# Patient Record
Sex: Male | Born: 1975 | Marital: Married
Health system: Southern US, Community
[De-identification: ages and names within clinical notes are randomized; demographics above are authoritative.]

## PROBLEM LIST (undated history)

## (undated) DIAGNOSIS — G959 Disease of spinal cord, unspecified: Secondary | ICD-10-CM

## (undated) DIAGNOSIS — E119 Type 2 diabetes mellitus without complications: Secondary | ICD-10-CM

## (undated) HISTORY — DX: Type 2 diabetes mellitus without complications: E11.9

## (undated) HISTORY — DX: Disease of spinal cord, unspecified: G95.9

---

## 2019-10-10 HISTORY — PX: CERVICAL FUSION: SHX112

## 2020-07-30 ENCOUNTER — Encounter: Payer: Self-pay | Admitting: Family Medicine

## 2020-08-26 ENCOUNTER — Other Ambulatory Visit: Payer: Self-pay

## 2020-08-26 ENCOUNTER — Ambulatory Visit (HOSPITAL_BASED_OUTPATIENT_CLINIC_OR_DEPARTMENT_OTHER)
Admission: RE | Admit: 2020-08-26 | Discharge: 2020-08-26 | Disposition: A | Discharge status: Home / Self Care | Payer: 59 | Source: Ambulatory Visit | Attending: Family Medicine | Admitting: Family Medicine

## 2020-08-26 ENCOUNTER — Ambulatory Visit (INDEPENDENT_AMBULATORY_CARE_PROVIDER_SITE_OTHER): Payer: 59 | Admitting: Family Medicine

## 2020-08-26 ENCOUNTER — Encounter: Payer: Self-pay | Admitting: Family Medicine

## 2020-08-26 VITALS — BP 140/60 | Ht 76.0 in | Wt 250.0 lb

## 2020-08-26 DIAGNOSIS — F0781 Postconcussional syndrome: Secondary | ICD-10-CM | POA: Insufficient documentation

## 2020-08-26 DIAGNOSIS — Z981 Arthrodesis status: Secondary | ICD-10-CM

## 2020-08-26 DIAGNOSIS — R55 Syncope and collapse: Secondary | ICD-10-CM

## 2020-08-26 DIAGNOSIS — S0993XA Unspecified injury of face, initial encounter: Secondary | ICD-10-CM | POA: Diagnosis not present
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
## 2020-08-26 NOTE — Assessment & Plan Note (Signed)
Surgery was in April 2021.  Reports he was having spinal stenosis and changes in the upper extremity.  Had felt improved after the surgery. -Counseled on supportive care. -X-ray.

## 2020-08-26 NOTE — Patient Instructions (Addendum)
Nice to meet you Please call the [phone] to make sure you have an appointment with the neurologist on 4/13. The information is down below.  I'll call with the xray results.   Please send me a message in MyChart with any questions or updates.  We'll setup a virtual visit once the CT is resulted. .   --Dr. Gerrie Nordmann, Norlene Campbell, MD  [address]  [place], [place] [address]  [phone] (Work)  [phone] (Fax)
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
## 2020-08-28 ENCOUNTER — Encounter: Payer: Self-pay | Admitting: Family Medicine

## 2020-08-30 ENCOUNTER — Encounter: Payer: Self-pay | Admitting: *Deleted

## 2020-08-30 ENCOUNTER — Telehealth: Payer: Self-pay | Admitting: Family Medicine

## 2020-08-30 DIAGNOSIS — Z981 Arthrodesis status: Secondary | ICD-10-CM

## 2020-08-30 NOTE — Telephone Encounter (Signed)
Informed of x-ray results.  Concern for loosening of his hardware.  His fusion.  We will proceed with CT of the cervical neck to evaluate for surgical hardware loosening.  Myra Rude, MD [facility] 08/30/2020, 9:28 AM

## 2020-09-03 ENCOUNTER — Ambulatory Visit (HOSPITAL_BASED_OUTPATIENT_CLINIC_OR_DEPARTMENT_OTHER): Payer: 59

## 2020-09-03 ENCOUNTER — Ambulatory Visit: Payer: 59

## 2020-09-03 ENCOUNTER — Ambulatory Visit (INDEPENDENT_AMBULATORY_CARE_PROVIDER_SITE_OTHER): Payer: 59

## 2020-09-03 ENCOUNTER — Other Ambulatory Visit: Payer: Self-pay

## 2020-09-03 DIAGNOSIS — M542 Cervicalgia: Secondary | ICD-10-CM | POA: Diagnosis not present

## 2020-09-03 DIAGNOSIS — F0781 Postconcussional syndrome: Secondary | ICD-10-CM

## 2020-09-03 DIAGNOSIS — Z981 Arthrodesis status: Secondary | ICD-10-CM

## 2020-09-03 DIAGNOSIS — S0993XA Unspecified injury of face, initial encounter: Secondary | ICD-10-CM | POA: Diagnosis not present

## 2020-09-03 DIAGNOSIS — R55 Syncope and collapse: Secondary | ICD-10-CM | POA: Diagnosis not present

## 2020-09-07 ENCOUNTER — Other Ambulatory Visit: Payer: Self-pay

## 2020-09-07 ENCOUNTER — Telehealth (INDEPENDENT_AMBULATORY_CARE_PROVIDER_SITE_OTHER): Payer: 59 | Admitting: Family Medicine

## 2020-09-07 VITALS — Ht 76.0 in | Wt 250.0 lb

## 2020-09-07 DIAGNOSIS — G8929 Other chronic pain: Secondary | ICD-10-CM | POA: Insufficient documentation

## 2020-09-07 DIAGNOSIS — M5442 Lumbago with sciatica, left side: Secondary | ICD-10-CM

## 2020-09-07 DIAGNOSIS — Z981 Arthrodesis status: Secondary | ICD-10-CM | POA: Diagnosis not present
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
## 2020-09-07 NOTE — Assessment & Plan Note (Signed)
CT shows the interbody spacer has not fused.  Does continue to have ongoing pain. -Counseled on home exercise therapy and supportive care. -Referral to neurosurgery.
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
## 2020-09-08 ENCOUNTER — Encounter: Payer: Self-pay | Admitting: Family Medicine

## 2020-10-15 ENCOUNTER — Encounter: Payer: Self-pay | Admitting: Neurology

## 2020-10-15 ENCOUNTER — Ambulatory Visit (INDEPENDENT_AMBULATORY_CARE_PROVIDER_SITE_OTHER): Payer: 59 | Admitting: Neurology

## 2020-10-15 VITALS — BP 125/77 | HR 90 | Ht 76.0 in | Wt 241.0 lb

## 2020-10-15 DIAGNOSIS — R55 Syncope and collapse: Secondary | ICD-10-CM | POA: Diagnosis not present

## 2020-10-15 DIAGNOSIS — G959 Disease of spinal cord, unspecified: Secondary | ICD-10-CM | POA: Diagnosis not present

## 2020-10-15 MED ORDER — CARBAMAZEPINE 200 MG PO TABS: ORAL_TABLET | ORAL | 3 refills | Status: DC
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
## 2020-10-15 NOTE — Patient Instructions (Addendum)
We will start carbamazepine for the pain.   Tegretol (carbamazepine) may result in dizziness, gait instability, cognitive slowing, or drowsiness. Sometimes, and allergic rash may occur, or a photosensitive rash may occur. If any significant side effects are noted, please contact our office.  

## 2020-10-19 ENCOUNTER — Telehealth: Payer: Self-pay | Admitting: Neurology

## 2020-10-19 NOTE — Telephone Encounter (Signed)
[employer] pending faxed notes  °

## 2020-10-20 ENCOUNTER — Encounter: Payer: Self-pay | Admitting: Family Medicine

## 2020-10-21 ENCOUNTER — Telehealth: Payer: Self-pay | Admitting: Neurology

## 2020-10-21 NOTE — Telephone Encounter (Signed)
LVM for patient to call back schedule

## 2020-10-21 NOTE — Telephone Encounter (Signed)
LVM for pt to call back to schedule [facility] 10/21/20 10/21/20 [employer] auth: [id number] (exp. 10/19/20 to 01/19/21) EE

## 2020-10-25 NOTE — Telephone Encounter (Signed)
Scheduled with patient over phone for 10/26/20 at [facility]

## 2020-10-26 ENCOUNTER — Ambulatory Visit (INDEPENDENT_AMBULATORY_CARE_PROVIDER_SITE_OTHER): Payer: 59

## 2020-10-26 DIAGNOSIS — G959 Disease of spinal cord, unspecified: Secondary | ICD-10-CM

## 2020-10-28 ENCOUNTER — Telehealth: Payer: Self-pay | Admitting: Neurology

## 2020-10-28 DIAGNOSIS — G894 Chronic pain syndrome: Secondary | ICD-10-CM

## 2020-10-28 MED ORDER — TIZANIDINE HCL 2 MG PO TABS: 2.0000 mg | ORAL_TABLET | Freq: Three times a day (TID) | ORAL | 1 refills | Status: AC

## 2020-10-28 NOTE — Telephone Encounter (Signed)
Pt called, asking my MRI result sent to Dr. Veronia Beets, [facility]. Would like a call from the nurse.

## 2020-10-28 NOTE — Telephone Encounter (Signed)
  I called the patient.  MRI of the cervical spine looks relatively unremarkable, no evidence of spinal cord injury, no evidence of nerve root compression at any level.  The patient however still having significant amount of pain with discomfort down the left arm and left leg.  He has already had EMG and nerve conduction study done previously.  The patient will be started on tizanidine, he will be set up for physical therapy and referred to a pain center.  We will consider [facility].    MRI cervical 10/28/20:  IMPRESSION:   MRI cervical spine (without) demonstrating: - At C4-5: disc bulging with mild spinal stenosis and no foraminal stenosis. - At C6-7: Uncovertebral joint hypertrophy with mild spinal stenosis and moderate right and mild left foraminal stenosis. - No cord signal abnormalities. - Stable ACDF at C6 and C7 levels.

## 2020-10-29 ENCOUNTER — Other Ambulatory Visit (HOSPITAL_BASED_OUTPATIENT_CLINIC_OR_DEPARTMENT_OTHER): Payer: Self-pay | Admitting: Neurology

## 2020-10-29 DIAGNOSIS — M5416 Radiculopathy, lumbar region: Secondary | ICD-10-CM

## 2020-11-01 NOTE — Telephone Encounter (Signed)
Done

## 2020-11-03 ENCOUNTER — Ambulatory Visit (INDEPENDENT_AMBULATORY_CARE_PROVIDER_SITE_OTHER): Payer: 59

## 2020-11-03 DIAGNOSIS — R55 Syncope and collapse: Secondary | ICD-10-CM | POA: Diagnosis not present

## 2020-11-03 NOTE — Procedures (Signed)
    History:  Gregory Bailey is a 45 year old gentleman with a history of a prior cervical spine procedure.  He has had a lot of pain following surgery, when the pain becomes severe, he will have a white out of vision, dizziness, and loss of consciousness.  The patient is being evaluated for these episodes.  This is a routine EEG.  No skull defects are noted.  Medications include Tegretol, gabapentin, Lexapro, Mirapex, trazodone, and Zanaflex.  EEG classification: Normal awake and drowsy  Description of the recording: The background rhythms of this recording consists of a fairly well modulated medium amplitude alpha rhythm of 9 Hz that is reactive to eye opening and closure. As the record progresses, the patient appears to remain in the waking state throughout the recording. Photic stimulation was performed, resulting in a bilateral and symmetric photic driving response. Hyperventilation was also performed, resulting in a minimal buildup of the background rhythm activities without significant slowing seen. Toward the end of the recording, the patient enters the drowsy state with slight symmetric slowing seen. The patient never enters stage II sleep. At no time during the recording does there appear to be evidence of spike or spike wave discharges or evidence of focal slowing. EKG monitor shows no evidence of cardiac rhythm abnormalities with a heart rate of 60.  Impression: This is a normal EEG recording in the waking and drowsy state. No evidence of ictal or interictal discharges are seen.

## 2020-11-04 ENCOUNTER — Telehealth: Payer: Self-pay | Admitting: Emergency Medicine

## 2020-11-04 NOTE — Telephone Encounter (Signed)
Reached patient by phone and discussed Dr. Clarisa Kindred review and findings from EEG study.  Patient denied further questions, verbalized understanding and expressed appreciation for the phone call.

## 2020-11-04 NOTE — Telephone Encounter (Signed)
-----   Message from York Spaniel, MD sent at 11/03/2020  4:57 PM EDT ----- Please call the patient let him know that the EEG study was unremarkable, no evidence of a tendency for seizures.  I suspect the blackout events are vasovagal or simple faint episodes triggered by pain.

## 2020-11-09 ENCOUNTER — Telehealth: Payer: Self-pay

## 2020-11-09 NOTE — Telephone Encounter (Signed)
Faxed referral to [facility] at [phone] for Pain Management. They can be contacted at [phone].

## 2020-11-11 NOTE — Telephone Encounter (Signed)
Received fax from [facility]. Patient is scheduled with them on 11/15/20 at 2:30.

## 2020-11-20 ENCOUNTER — Other Ambulatory Visit: Payer: Self-pay

## 2020-11-20 ENCOUNTER — Ambulatory Visit (HOSPITAL_BASED_OUTPATIENT_CLINIC_OR_DEPARTMENT_OTHER)
Admission: RE | Admit: 2020-11-20 | Discharge: 2020-11-20 | Disposition: A | Discharge status: Home / Self Care | Payer: 59 | Source: Ambulatory Visit | Attending: Neurology | Admitting: Neurology

## 2020-11-20 DIAGNOSIS — M5416 Radiculopathy, lumbar region: Secondary | ICD-10-CM

## 2020-11-20 MED ORDER — GADOBUTROL 1 MMOL/ML IV SOLN
9.0000 mL | Freq: Once | INTRAVENOUS | Status: AC | PRN
  Administered 2020-11-20: 9 mL via INTRAVENOUS

## 2020-12-25 ENCOUNTER — Other Ambulatory Visit: Payer: Self-pay | Admitting: Neurology

## 2021-02-11 ENCOUNTER — Other Ambulatory Visit: Payer: Self-pay | Admitting: Neurology

## 2021-02-17 ENCOUNTER — Telehealth: Payer: Self-pay

## 2021-02-17 ENCOUNTER — Ambulatory Visit: Payer: 59 | Admitting: Neurology

## 2021-02-17 ENCOUNTER — Encounter: Payer: Self-pay | Admitting: Neurology

## 2021-02-17 ENCOUNTER — Telehealth: Payer: Self-pay | Admitting: Neurology

## 2021-02-17 NOTE — Telephone Encounter (Signed)
This patient did not show for a revisit appointment today.  It looks like he is followed through another neurologist at this point, Dr. Ansel Bong.

## 2021-02-17 NOTE — Telephone Encounter (Addendum)
error 

## 2021-02-17 NOTE — Telephone Encounter (Signed)
Pt missed his appt that was scheduled for 02/17/2021, needing to reschedule. Pt requesting a call back.

## 2021-02-17 NOTE — Telephone Encounter (Signed)
See pt message and Dr. Anne Hahn note from 02/17/21

## 2021-05-17 ENCOUNTER — Emergency Department (HOSPITAL_BASED_OUTPATIENT_CLINIC_OR_DEPARTMENT_OTHER): Payer: Medicaid Other

## 2021-05-17 ENCOUNTER — Emergency Department (HOSPITAL_BASED_OUTPATIENT_CLINIC_OR_DEPARTMENT_OTHER)
Admission: EM | Admit: 2021-05-17 | Discharge: 2021-05-18 | Disposition: A | Discharge status: Home / Self Care | Payer: Medicaid Other | Attending: Emergency Medicine | Admitting: Emergency Medicine

## 2021-05-17 ENCOUNTER — Other Ambulatory Visit: Payer: Self-pay

## 2021-05-17 ENCOUNTER — Encounter (HOSPITAL_BASED_OUTPATIENT_CLINIC_OR_DEPARTMENT_OTHER): Payer: Self-pay | Admitting: Emergency Medicine

## 2021-05-17 DIAGNOSIS — M5442 Lumbago with sciatica, left side: Secondary | ICD-10-CM | POA: Diagnosis not present

## 2021-05-17 DIAGNOSIS — M5136 Other intervertebral disc degeneration, lumbar region: Secondary | ICD-10-CM | POA: Insufficient documentation

## 2021-05-17 DIAGNOSIS — M545 Low back pain, unspecified: Secondary | ICD-10-CM | POA: Diagnosis present

## 2021-05-17 DIAGNOSIS — M5137 Other intervertebral disc degeneration, lumbosacral region: Secondary | ICD-10-CM

## 2021-05-17 DIAGNOSIS — M5432 Sciatica, left side: Secondary | ICD-10-CM

## 2021-05-17 MED ORDER — DEXAMETHASONE SODIUM PHOSPHATE 10 MG/ML IJ SOLN
10.0000 mg | Freq: Once | INTRAMUSCULAR | Status: AC
  Administered 2021-05-17: 10 mg via INTRAMUSCULAR

## 2021-05-17 MED ORDER — KETOROLAC TROMETHAMINE 60 MG/2ML IM SOLN
30.0000 mg | Freq: Once | INTRAMUSCULAR | Status: AC
  Administered 2021-05-17: 30 mg via INTRAMUSCULAR

## 2021-05-17 MED ORDER — LIDOCAINE 5 % EX PTCH
3.0000 | MEDICATED_PATCH | CUTANEOUS | Status: DC
  Administered 2021-05-17: 3 via TRANSDERMAL

## 2021-05-17 NOTE — ED Triage Notes (Addendum)
Lower back pain , no injury to back. Hx degenerative disc , unable to ambulate due to pain . Left leg numbness

## 2021-05-17 NOTE — ED Notes (Signed)
Patient transported to X-ray 

## 2021-05-18 ENCOUNTER — Encounter (HOSPITAL_BASED_OUTPATIENT_CLINIC_OR_DEPARTMENT_OTHER): Payer: Self-pay | Admitting: Emergency Medicine

## 2021-05-18 MED ORDER — MELOXICAM 15 MG PO TABS: 15.0000 mg | ORAL_TABLET | Freq: Every day | ORAL | 0 refills | Status: AC

## 2021-05-18 MED ORDER — LIDOCAINE 5 % EX PTCH: 1.0000 | MEDICATED_PATCH | CUTANEOUS | 0 refills | Status: AC
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
## 2022-01-04 LAB — GLUCOSE, POCT (MANUAL RESULT ENTRY): POC Glucose: 121 mg/dl — AB (ref 70–99)

## 2022-01-04 LAB — POCT GLYCOSYLATED HEMOGLOBIN (HGB A1C): Hemoglobin-A1c: 5.9

## 2022-09-28 IMAGING — MR MR LUMBAR SPINE WO/W CM
4 of 7 series · 26 of 48 positions shown · IV contrast (gadavist)
Comparison: None.

CLINICAL DATA: 45-year-old male with progressive low back pain,
left leg pain with burning numbness and tingling.

EXAM:
MRI LUMBAR SPINE WITHOUT AND WITH CONTRAST
TECHNIQUE: Multiplanar and multiecho pulse sequences of the lumbar spine were
obtained without and with intravenous contrast.
CONTRAST:  9mL GADAVIST GADOBUTROL 1 MMOL/ML IV SOLN

[Series 6: T1 · sagittal · 4.0mm · 0.51mm/px · 4 of 15 slices shown (1 of 2)]
[im 1/15]
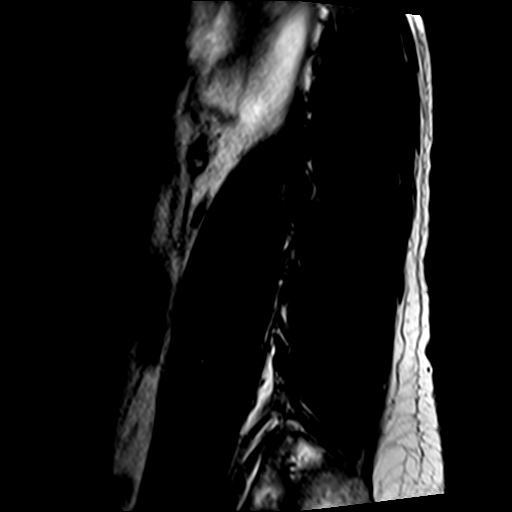
[im 5/15]
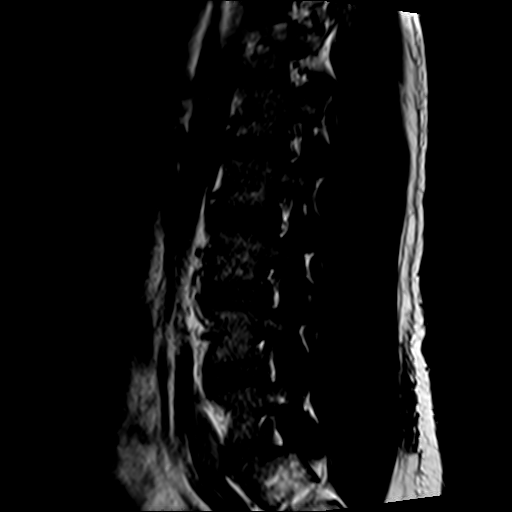
[im 10/15]
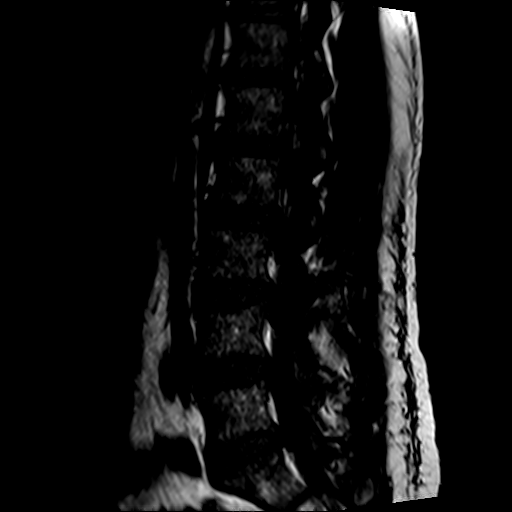
[im 15/15]
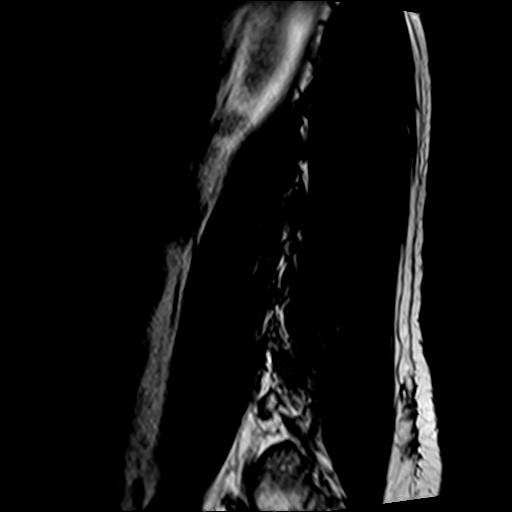

[Series 7: T2 · axial · 4.0mm · 0.70mm/px · z∈[-101,+135]mm · 11 of 48 slices shown (1 of 2)]
[im 1/48]
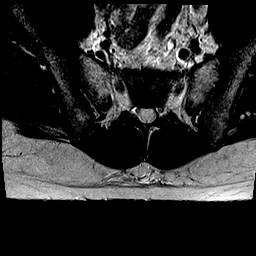
[im 5/48]
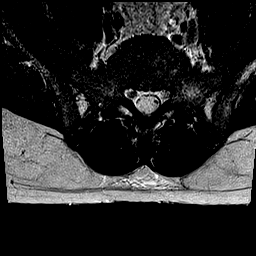
[im 10/48]
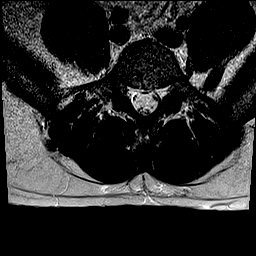
[im 15/48]
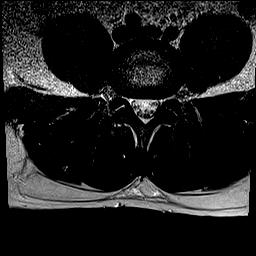
[im 19/48]
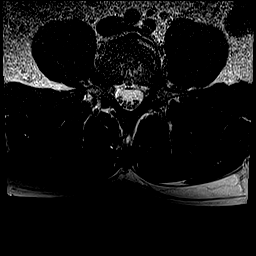
[im 24/48]
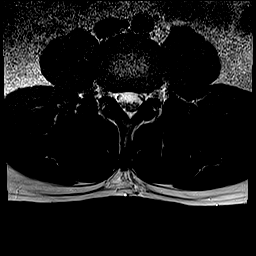
[im 29/48]
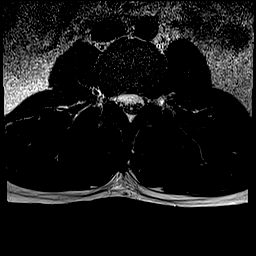
[im 33/48]
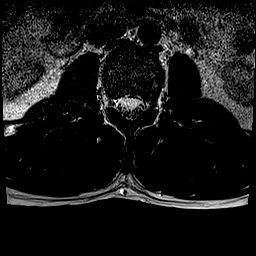
[im 38/48]
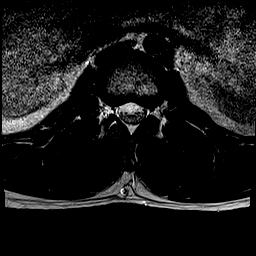
[im 43/48]
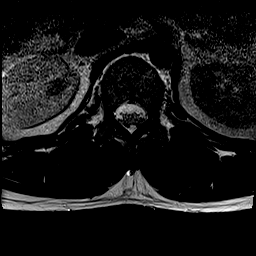
[im 48/48]
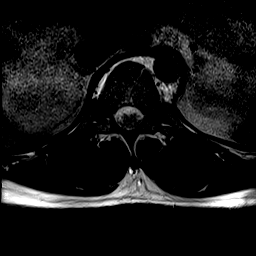

[Series 8: T1 · axial · 4.0mm · 0.39mm/px · z∈[-102,+110]mm · 7 of 48 slices shown (2 of 2)]
[im 1/48]
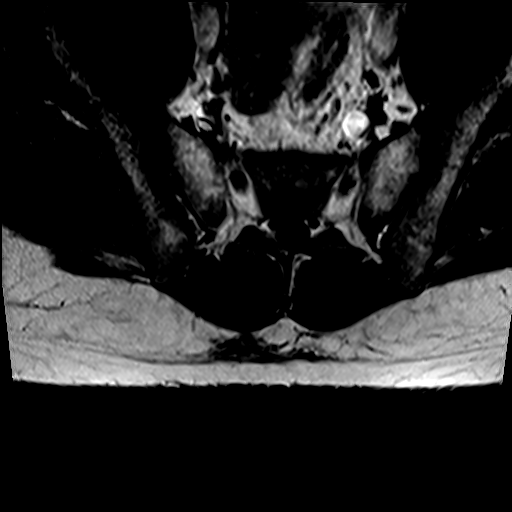
[im 5/48]
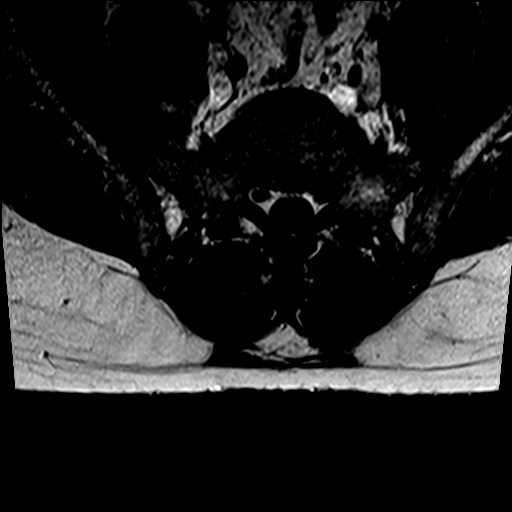
[im 10/48]
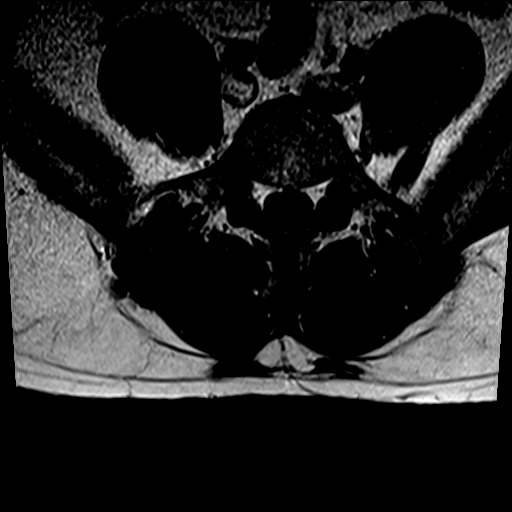
[im 15/48]
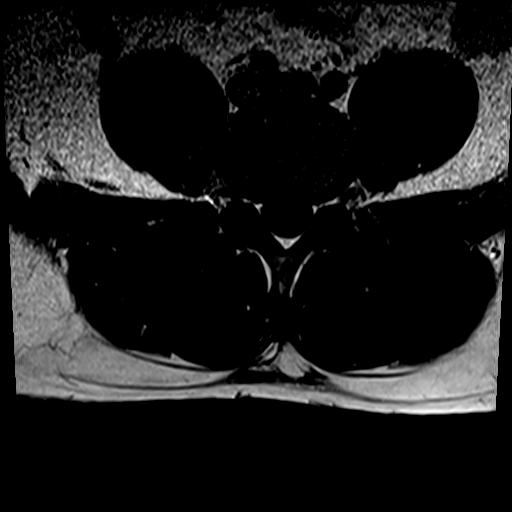
[im 19/48]
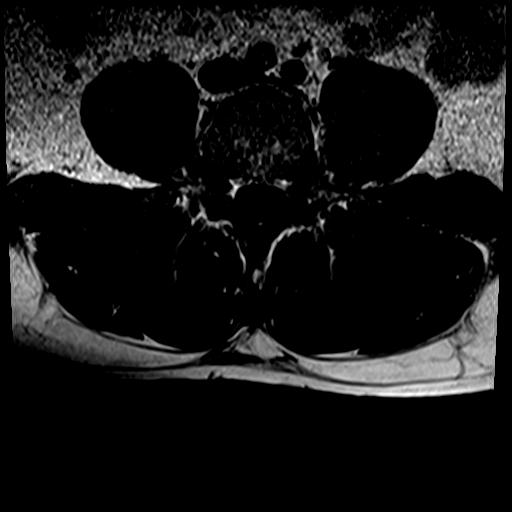
[im 24/48]
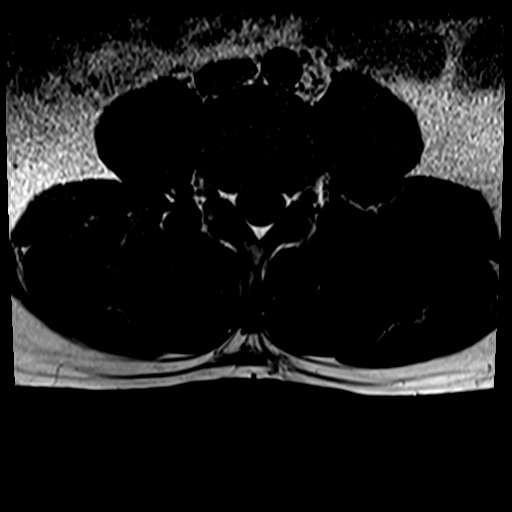
[im 43/48]
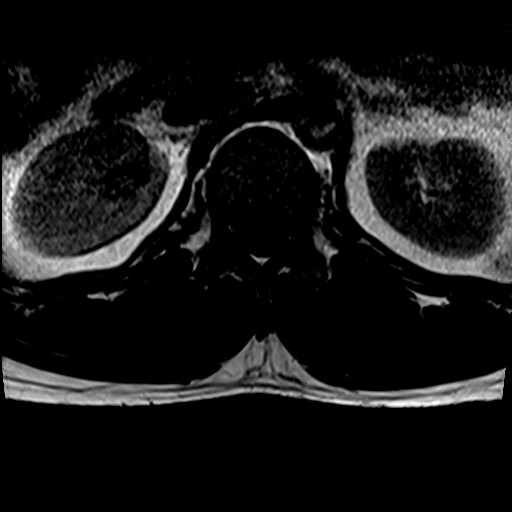

[Series 10: T2 · sagittal · 4.0mm · 0.55mm/px · 4 of 15 slices shown (2 of 2)]
[im 1/15]
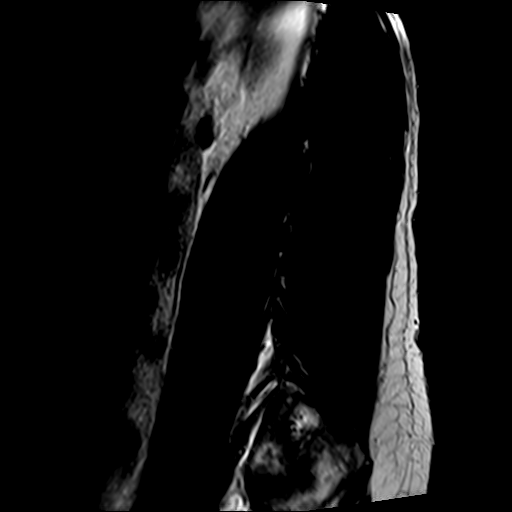
[im 5/15]
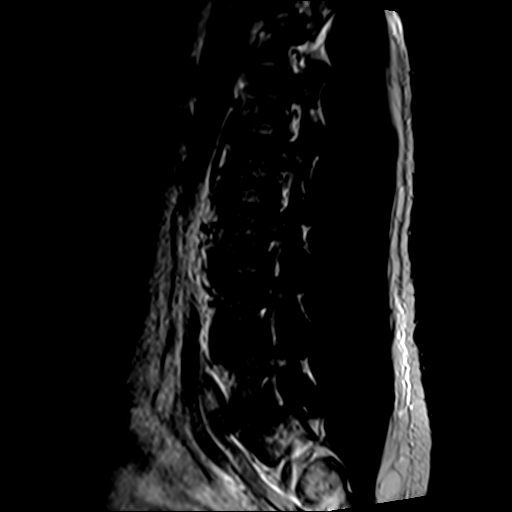
[im 10/15]
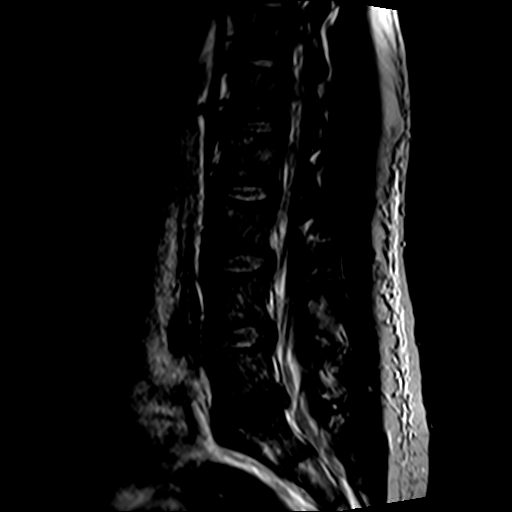
[im 15/15]
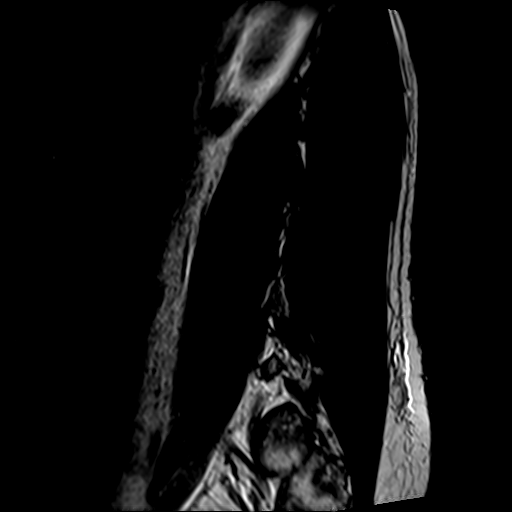

[26 of 48 positions shown; findings below may reference images not displayed]

FINDINGS: Segmentation: Lumbar segmentation appears to be normal and will be
designated as such for this report.

Alignment:  Straightening of lumbar lordosis.  No spondylolisthesis.

Vertebrae: No marrow edema or evidence of acute osseous abnormality.
Background bone marrow signal is within normal limits. Intact
visible sacrum and SI joints.

Conus medullaris and cauda equina: Conus extends to the L1 level. No
lower spinal cord or conus signal abnormality. No abnormal
intradural enhancement. No dural thickening.

Paraspinal and other soft tissues: Negative.

Disc levels:

Normal intervertebral disc signal and morphology from the visible
lower thoracic spine through L4-L5. No stenosis or neural
impingement at those levels.

L5-S1: Disc desiccation. Mild circumferential disc bulge and
broad-based superimposed right paracentral disc protrusion with
annular fissure of the disc (series 7, image 42) which enhance is
following contrast (series 12, image 42). Mild mass effect at the
right lateral recess, descending right S1 nerve levels. Mild
superimposed bilateral facet hypertrophy. No spinal stenosis. No
convincing L5 foraminal stenosis.
IMPRESSION: Isolated lumbar disc degeneration at L5-S1 with a small broad-based
right paracentral disc herniation and annular fissure. Mild mass
effect on the right lateral recess. Query right S1 radiculitis. No
spinal or convincing neural foraminal stenosis.

## 2022-10-02 ENCOUNTER — Encounter: Payer: Self-pay | Admitting: *Deleted

## 2022-12-11 ENCOUNTER — Ambulatory Visit: Payer: Medicaid Other | Attending: Nurse Practitioner | Admitting: Physical Therapy

## 2022-12-11 ENCOUNTER — Encounter: Payer: Self-pay | Admitting: Physical Therapy

## 2022-12-11 ENCOUNTER — Telehealth: Payer: Self-pay | Admitting: Physical Therapy

## 2022-12-11 DIAGNOSIS — R42 Dizziness and giddiness: Secondary | ICD-10-CM | POA: Insufficient documentation

## 2022-12-11 DIAGNOSIS — R2681 Unsteadiness on feet: Secondary | ICD-10-CM | POA: Insufficient documentation

## 2022-12-11 NOTE — Telephone Encounter (Signed)
Called patient's neurosurgeon for more details pertaining to current cervical precautions as they pertain to vestibular physical therapy.  Maryruth Eve, PT, DPT
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
## 2022-12-19 ENCOUNTER — Ambulatory Visit: Payer: Medicare Other | Attending: Nurse Practitioner | Admitting: Physical Therapy

## 2022-12-19 ENCOUNTER — Encounter: Payer: Self-pay | Admitting: Physical Therapy

## 2022-12-19 VITALS — BP 131/90

## 2022-12-19 DIAGNOSIS — R2681 Unsteadiness on feet: Secondary | ICD-10-CM | POA: Insufficient documentation

## 2022-12-19 DIAGNOSIS — R42 Dizziness and giddiness: Secondary | ICD-10-CM | POA: Diagnosis present

## 2022-12-19 NOTE — Therapy (Signed)
OUTPATIENT PHYSICAL THERAPY VESTIBULAR TREATMENT   Patient Name: Gregory Bailey MRN: [id number] DOB:1975-07-25, 47 y.o., male Today's Date: 12/19/2022  END OF SESSION:  PT End of Session - 12/19/22 0900     Visit Number 2    Number of Visits 7    Date for PT Re-Evaluation 02/05/23    Authorization Type [place] Medicaid    PT Start Time 0858    PT Stop Time 0952    PT Time Calculation (min) 54 min    Equipment Utilized During Treatment Gait belt    Activity Tolerance Patient tolerated treatment well    Behavior During Therapy WFL for tasks assessed/performed             Past Medical History:  Diagnosis Date   Cervical myelopathy (HCC)    Past Surgical History:  Procedure Laterality Date   CERVICAL FUSION  10/10/2019   Patient Active Problem List   Diagnosis Date Noted   Chronic bilateral low back pain with left-sided sciatica 09/07/2020   S/P cervical spinal fusion 08/26/2020   Post concussion syndrome 08/26/2020   Syncope 08/26/2020   Injury of face 08/26/2020    PCP: Terri Skains REFERRING PROVIDER: Nechama Guard, FN  REFERRING DIAG: H81.10 (ICD-10-CM) - BPPV (benign paroxysmal positional vertigo)  THERAPY DIAG:  Dizziness and giddiness  Unsteadiness on feet  ONSET DATE: 11/29/2022 (referral)  Rationale for Evaluation and Treatment: Rehabilitation  SUBJECTIVE:   SUBJECTIVE STATEMENT: Patient reports that he feels better if he takes time when getting up and down. Reports the greatest challenges when he gets up too fast. He reports that he occasionally still gets room spinning feeling when he lays down one way more than the other. Reports that he feels like he bumps into things.   Pt accompanied by: self  PERTINENT HISTORY: suspected L BPPV per ENT findings, episodes of syncope in 2022 (no recent episodes in 6 months, is on a cardiac monitor for per patient 2 years), cervical myleopathy s/p cervical ACDF C5-C7  in 11/21/2021. In wheelchair in November  2023,   PAIN:  Are you having pain? No  PRECAUTIONS: Fall and Other: 20-30 lbs following cervical precautions, will follow up with neurosurgery team to see if any ongoing additional deficits , patient reports cleared from cervical collar   WEIGHT BEARING RESTRICTIONS:  see above  FALLS: Has patient fallen in last 6 months? Yes. Number of falls fell multiple times back in bed, cannot recall exactly how many  LIVING ENVIRONMENT: Lives with: lives with their family - two kids Lives in: House/apartment Stairs: Yes: Internal: 15 steps; can reach both and External: 4 steps; can reach both Has following equipment at home: Wheelchair (manual)  PLOF: Independent  PATIENT GOALS: Improved dizziness and figure out what is going on  OBJECTIVE:   Vitals:   12/19/22 0910  BP: (!) 131/90    DIAGNOSTIC FINDINGS:   IMPRESSION on 09/04/2022: Normal head and maxillofacial CT scans.   Status post C6-7 ACDF. Hardware is intact without evidence of loosening. There is mild subsidence of the interbody spacer into the C6 and C6-7 endplates. Only a few small areas of osseous fusion across the disc interspace are identified. The vast majority of the interspace is not yet fused.  COGNITION: Overall cognitive status:  reports forgetting a few things/difficultly scheduling appointments    VESTIBULAR TREATMENT:  POSITIONAL TESTING:  Modified positional testing for posterior canals initially with sidelying test - negative bilaterally  Roll test: negative bilaterally Modified L Dix Hallpike 4" table riser to put mat in trendelenburg with head down and use of pillow to keep head in neutral: negative Modified R Dix Hallpike  4" table riser to put mat in trendelenburg with head down and use of pillow to keep head in neutral: negative  MOTION SENSITIVITY:  Motion Sensitivity Quotient Intensity: 0 = none, 1  = Lightheaded, 2 = Mild, 3 = Moderate, 4 = Severe, 5 = Vomiting  Intensity  1. Sitting to supine 2  2. Supine to L side 3  3. Supine to R side 0  4. Supine to sitting 4  5. L Hallpike-Dix (modified) 2  6. Up from L  4  7. R Hallpike-Dix (modified) 4  8. Up from R  3  9. Sitting, head tipped to L knee 2  10. Head up from L knee 2  11. Sitting, head tipped to R knee 0  12. Head up from R knee 0  13. Sitting head turns x5 2  14.Sitting head nods x5 4  15. In stance, 180 turn to L  0  16. In stance, 180 turn to R 0   mCTSIB: EO on firm: 30" with min sway EC on firm: 30" with min sway EO on foam: 30" with min sway EC on foam: 30" with mod sway  PATIENT EDUCATION: Education details: examination findings, POC,  Person educated: Patient Education method: Explanation Education comprehension: verbalized understanding and needs further education  HOME EXERCISE PROGRAM:  GOALS: Goals reviewed with patient? Yes  SHORT TERM GOALS: Target date: 01/01/2023  Patient will demonstrate 100% compliance with initial HEP to continue to progress between physical therapy sessions.   Baseline: To be provided Goal status: INITIAL  2.  Therapist will finish assessing MSQ and write long term goal as indicated. Baseline: 4/5 at worst thus far Goal status: INITIAL  3.  Therapist will assess positional testing (modified as needed) and treat as indicated. Baseline: Assessed Goal status: MET  4.  Therapist will assess SOT and write long term goal as indicated. Baseline: mCTSIB passed - patient too high level Goal status: REVISED  5.  Therapist will assess FGA and write long term goal as indicated.  Baseline: To be assessed Goal status: INITIAL   LONG TERM GOALS: Target date: 01/22/2023  Patient will report demonstrate independence with final HEP in order to maintain current gains and continue to progress after physical therapy discharge.   Baseline: To be provided Goal status:  INITIAL  2.  Patient will improve FOTO score to 53 to achieve predicted improvements in functional mobility due to skilled physical therapy interventions to increase safety with and participation in daily activities. Baseline: 39 Goal status: INITIAL  3.  MSQ to be assessed/goal written Baseline: To be assessed Goal status: INITIAL  4.  FGA to be assessed/goal written Baseline: To be assessed Goal status: INITIAL  5.  SOT to be assessed/goal written Baseline: mCTSIB too high level Goal status: REVISED   ASSESSMENT:  CLINICAL IMPRESSION: Session emphasized continued vestibular assessment. Patient not found to have any symptoms consistent with BPPV. Patient does present with high levels of motion sensitivity that appear to also have increased response with task that increase anxiety. Patient does present with min-mod sway with all components on mCTSIB but is able to maintain balance. Will plan to work on habituation and higher level  balance tasks in future sessions.  OBJECTIVE IMPAIRMENTS: Abnormal gait, decreased balance, and dizziness.   ACTIVITY LIMITATIONS: bending, transfers, bed mobility, and locomotion level  PARTICIPATION LIMITATIONS: cleaning, laundry, community activity, and yard work  PERSONAL FACTORS: Time since onset of injury/illness/exacerbation and 1-2 comorbidities: see above  are also affecting patient's functional outcome.   REHAB POTENTIAL: Fair complex medical history but eager to participate  CLINICAL DECISION MAKING: Evolving/moderate complexity  EVALUATION COMPLEXITY: Moderate   PLAN:  PT FREQUENCY: 1-2x/week  PT DURATION: 6 weeks  PLANNED INTERVENTIONS: Therapeutic exercises, Therapeutic activity, Neuromuscular re-education, Balance training, Gait training, Patient/Family education, Self Care, Vestibular training, Canalith repositioning, Manual therapy, and Re-evaluation  PLAN FOR NEXT SESSION: SOT, DVA, FGA - write goals as indicated, work on  higher level balance   Carmelia Bake, PT, DPT 12/19/2022, 1:11 PM   Check all possible CPT codes: 16109 - PT Re-evaluation, 97110- Therapeutic Exercise, [phone]- Neuro Re-education, [phone] - Gait Training, [phone] - Manual Therapy, 97530 - Therapeutic Activities, [phone] - Self Care, and [phone] - Canalith Repositioning    Check all conditions that are expected to impact treatment: {Conditions expected to impact treatment:Neurological condition and/or seizures   If treatment provided at initial evaluation, no treatment charged due to lack of authorization.

## 2022-12-25 ENCOUNTER — Telehealth: Payer: Self-pay | Admitting: Physical Therapy

## 2022-12-25 NOTE — Telephone Encounter (Signed)
Therapist called patient back as patient had called front desk wising to inform therapist of progress. Patient informed therapist that he is feeling great and nausea feeling has been much more limited. He stated that his dizzy spells have been fewer between. Patient ask therapist if he needs to do anything to help maintain gains. Therapist encouraged patient to keep doing gentle activity such as a walk etc as able and would provide more detailed HEP next session.  Maryruth Eve, PT, DPT

## 2022-12-28 ENCOUNTER — Ambulatory Visit: Payer: Medicare Other | Admitting: Physical Therapy

## 2022-12-28 ENCOUNTER — Telehealth: Payer: Self-pay | Admitting: Physical Therapy

## 2022-12-28 NOTE — Telephone Encounter (Signed)
Called and spoke with patient regarding cancellation/no show policy. Patient reports that he will be at next appointment and verbalizes understanding of policy.  Maryruth Eve, PT, DPT

## 2023-01-02 ENCOUNTER — Ambulatory Visit: Payer: Medicare Other | Admitting: Physical Therapy

## 2023-01-02 ENCOUNTER — Encounter: Payer: Self-pay | Admitting: Physical Therapy

## 2023-01-02 VITALS — BP 125/82 | HR 65

## 2023-01-02 DIAGNOSIS — R42 Dizziness and giddiness: Secondary | ICD-10-CM | POA: Diagnosis not present

## 2023-01-02 DIAGNOSIS — R2681 Unsteadiness on feet: Secondary | ICD-10-CM

## 2023-01-02 NOTE — Therapy (Signed)
OUTPATIENT PHYSICAL THERAPY VESTIBULAR TREATMENT   Patient Name: Gregory Bailey MRN: [id number] DOB:[birth year], 47 y.o., male Today's Date: 01/02/2023  END OF SESSION:  PT End of Session - 01/02/23 0852     Visit Number 3    Number of Visits 7    Date for PT Re-Evaluation 02/05/23    Authorization Type [place] Medicaid    PT Start Time [phone]    PT Stop Time 0930    PT Time Calculation (min) 39 min    Equipment Utilized During Treatment Gait belt    Activity Tolerance Patient tolerated treatment well    Behavior During Therapy WFL for tasks assessed/performed             Past Medical History:  Diagnosis Date   Cervical myelopathy (HCC)    Past Surgical History:  Procedure Laterality Date   CERVICAL FUSION  10/10/2019   Patient Active Problem List   Diagnosis Date Noted   Chronic bilateral low back pain with left-sided sciatica 09/07/2020   S/P cervical spinal fusion 08/26/2020   Post concussion syndrome 08/26/2020   Syncope 08/26/2020   Injury of face 08/26/2020    PCP: Terri Skains REFERRING PROVIDER: Nechama Guard, FN  REFERRING DIAG: H81.10 (ICD-10-CM) - BPPV (benign paroxysmal positional vertigo)  THERAPY DIAG:  Dizziness and giddiness  Unsteadiness on feet  ONSET DATE: 11/29/2022 (referral)  Rationale for Evaluation and Treatment: Rehabilitation  SUBJECTIVE:   SUBJECTIVE STATEMENT: Patient reports that he is doing very well. He feels like his nausea has gone from being all the time to just some of the time. Patient is unsure what has happened to make him feel so much better but he is doing great. Patient states he knows the floor is now flat. His dizzy spells are only lasting a few seconds. Reports he had one near fall when he got up too quick. Patient reports that he has been playing with his kids as the main source of mobility.   Pt accompanied by: self  PERTINENT HISTORY: suspected L BPPV per ENT findings, episodes of syncope in 2022 (no  recent episodes in 6 months, is on a cardiac monitor for per patient 2 years), cervical myleopathy s/p cervical ACDF C5-C7  in 11/21/2021. In wheelchair in November 2023,   PAIN:  Are you having pain? No  PRECAUTIONS: Fall and Other: 20-30 lbs following cervical precautions, will follow up with neurosurgery team to see if any ongoing additional deficits , patient reports cleared from cervical collar   WEIGHT BEARING RESTRICTIONS:  see above  FALLS: Has patient fallen in last 6 months? Yes. Number of falls fell multiple times back in bed, cannot recall exactly how many  LIVING ENVIRONMENT: Lives with: lives with their family - two kids Lives in: House/apartment Stairs: Yes: Internal: 15 steps; can reach both and External: 4 steps; can reach both Has following equipment at home: Wheelchair (manual)  PLOF: Independent  PATIENT GOALS: Improved dizziness and figure out what is going on  OBJECTIVE:   Vitals:   01/02/23 0900  BP: 125/82  Pulse: 65    DIAGNOSTIC FINDINGS:   IMPRESSION on 09/04/2022: Normal head and maxillofacial CT scans.   Status post C6-7 ACDF. Hardware is intact without evidence of loosening. There is mild subsidence of the interbody spacer into the C6 and C6-7 endplates. Only a few small areas of osseous fusion across the disc interspace are identified. The vast majority of the interspace is not yet fused.  COGNITION: Overall cognitive status:  reports forgetting a few things/difficultly scheduling appointments    VESTIBULAR TREATMENT:                                                                                                    Sensory Organization Test Component 1: 3/3 passed  Component 2: 3/3 passed Component 3: 1/3 passed, 0 falls Component 4: 0/3 passed, 0 falls Component 5: 0/3 passed, 3 falls Component 6: 0/3 passed, 3 falls  Visual: Slightly below age matched norms Vestibular: Well below age matched norms Somatosensory: Above age  matched  Composite: 58 Points (Below Age Matched Norms)   Patinet requested hand sanitizer between 5/6 round due to some mild nausea. Wished to continue the test and able to complete. Remainder of session spent reviewing results/answering patient's questions.   PATIENT EDUCATION: Education details: examination findings, POC,  Person educated: Patient Education method: Explanation Education comprehension: verbalized understanding and needs further education  HOME EXERCISE PROGRAM:  To be provided  GOALS: Goals reviewed with patient? Yes  SHORT TERM GOALS: Target date: 01/01/2023  Patient will demonstrate 100% compliance with initial HEP to continue to progress between physical therapy sessions.   Baseline: To be provided Goal status: INITIAL  2.  Therapist will finish assessing MSQ and write long term goal as indicated. Baseline: 4/5 at worst thus far Goal status: INITIAL  3.  Therapist will assess positional testing (modified as needed) and treat as indicated. Baseline: Assessed Goal status: MET  4.  Therapist will assess SOT and write long term goal as indicated. Baseline: mCTSIB passed - patient too high level Goal status: REVISED  5.  Therapist will assess FGA and write long term goal as indicated.  Baseline: To be assessed Goal status: INITIAL   LONG TERM GOALS: Target date: 01/22/2023  Patient will report demonstrate independence with final HEP in order to maintain current gains and continue to progress after physical therapy discharge.   Baseline: To be provided Goal status: INITIAL  2.  Patient will improve FOTO score to 53 to achieve predicted improvements in functional mobility due to skilled physical therapy interventions to increase safety with and participation in daily activities. Baseline: 39 Goal status: INITIAL  3. Patient will improve MSQ score to 2 or less on tested components indicate reduction in motion sensitivity in order to progress towards  baseline function.   Baseline: > was 4/5 Goal status: INITIAL  4.  FGA to be assessed/goal written Baseline: To be assessed Goal status: INITIAL  5.  Patient will improve SOT goal by 8 points to demonstrate improved integration of visual, vestibular, and somatosensory balance systems in order to return to PLOF.   Baseline: 46 points Goal status: REVISED   ASSESSMENT:  CLINICAL IMPRESSION: Session emphasized continued vestibular assessment. Patient not found to have any symptoms consistent with BPPV. Patient does present with high levels of motion sensitivity that appear to also have increased response with task that increase anxiety. Patient does present with min-mod sway with all components on mCTSIB but is able to maintain balance. Will plan to work on habituation and higher level balance  tasks in future sessions.  OBJECTIVE IMPAIRMENTS: Abnormal gait, decreased balance, and dizziness.   ACTIVITY LIMITATIONS: bending, transfers, bed mobility, and locomotion level  PARTICIPATION LIMITATIONS: cleaning, laundry, community activity, and yard work  PERSONAL FACTORS: Time since onset of injury/illness/exacerbation and 1-2 comorbidities: see above  are also affecting patient's functional outcome.   REHAB POTENTIAL: Fair complex medical history but eager to participate  CLINICAL DECISION MAKING: Evolving/moderate complexity  EVALUATION COMPLEXITY: Moderate   PLAN:  PT FREQUENCY: 1-2x/week  PT DURATION: 6 weeks  PLANNED INTERVENTIONS: Therapeutic exercises, Therapeutic activity, Neuromuscular re-education, Balance training, Gait training, Patient/Family education, Self Care, Vestibular training, Canalith repositioning, Manual therapy, and Re-evaluation  PLAN FOR NEXT SESSION:  FGA - write goals as indicated, work on higher level balance + create HEP   Carmelia Bake, PT, DPT 01/02/2023, 10:47 AM   Check all possible CPT codes: 09604 - PT Re-evaluation, 97110- Therapeutic  Exercise, [phone]- Neuro Re-education, [phone] - Gait Training, [phone] - Manual Therapy, 97530 - Therapeutic Activities, [phone] - Self Care, and [phone] - Canalith Repositioning    Check all conditions that are expected to impact treatment: {Conditions expected to impact treatment:Neurological condition and/or seizures   If treatment provided at initial evaluation, no treatment charged due to lack of authorization.

## 2023-01-12 ENCOUNTER — Encounter: Payer: Self-pay | Admitting: Physical Therapy

## 2023-01-12 ENCOUNTER — Ambulatory Visit: Payer: Medicare Other | Admitting: Physical Therapy

## 2023-01-12 VITALS — BP 126/80 | HR 92

## 2023-01-12 DIAGNOSIS — R42 Dizziness and giddiness: Secondary | ICD-10-CM | POA: Diagnosis not present

## 2023-01-12 DIAGNOSIS — R2681 Unsteadiness on feet: Secondary | ICD-10-CM
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
## 2023-01-16 ENCOUNTER — Ambulatory Visit: Payer: Medicare Other | Admitting: Physical Therapy

## 2023-01-25 ENCOUNTER — Ambulatory Visit: Payer: Self-pay | Admitting: Physical Therapy

## 2023-04-23 ENCOUNTER — Other Ambulatory Visit: Payer: Self-pay | Admitting: Family Medicine

## 2023-04-23 DIAGNOSIS — G894 Chronic pain syndrome: Secondary | ICD-10-CM

## 2023-04-23 DIAGNOSIS — M25519 Pain in unspecified shoulder: Secondary | ICD-10-CM

## 2023-10-05 ENCOUNTER — Other Ambulatory Visit: Payer: Self-pay | Admitting: Nurse Practitioner

## 2023-10-05 ENCOUNTER — Ambulatory Visit
Admission: RE | Admit: 2023-10-05 | Discharge: 2023-10-05 | Disposition: A | Discharge status: Home / Self Care | Source: Ambulatory Visit | Attending: Nurse Practitioner | Admitting: Nurse Practitioner

## 2023-10-05 ENCOUNTER — Other Ambulatory Visit

## 2023-10-05 DIAGNOSIS — I808 Phlebitis and thrombophlebitis of other sites: Secondary | ICD-10-CM

## 2024-03-24 ENCOUNTER — Other Ambulatory Visit (HOSPITAL_COMMUNITY): Payer: Self-pay | Admitting: Physician Assistant

## 2024-03-24 DIAGNOSIS — M5412 Radiculopathy, cervical region: Secondary | ICD-10-CM

## 2024-03-31 ENCOUNTER — Ambulatory Visit (HOSPITAL_COMMUNITY)
Admission: RE | Admit: 2024-03-31 | Discharge: 2024-03-31 | Disposition: A | Discharge status: Home / Self Care | Source: Ambulatory Visit | Attending: Physician Assistant | Admitting: Physician Assistant

## 2024-03-31 DIAGNOSIS — M5412 Radiculopathy, cervical region: Secondary | ICD-10-CM | POA: Insufficient documentation
